# Patient Record
Sex: Female | Born: 1940 | Race: Black or African American | Hispanic: No | Marital: Married
Health system: Southern US, Community
[De-identification: ages and names within clinical notes are randomized; demographics above are authoritative.]

## PROBLEM LIST (undated history)

## (undated) DIAGNOSIS — I10 Essential (primary) hypertension: Secondary | ICD-10-CM

## (undated) DIAGNOSIS — D649 Anemia, unspecified: Secondary | ICD-10-CM

## (undated) DIAGNOSIS — R5383 Other fatigue: Secondary | ICD-10-CM

## (undated) DIAGNOSIS — E7800 Pure hypercholesterolemia, unspecified: Secondary | ICD-10-CM

## (undated) DIAGNOSIS — K219 Gastro-esophageal reflux disease without esophagitis: Secondary | ICD-10-CM

## (undated) DIAGNOSIS — I7300 Raynaud's syndrome without gangrene: Secondary | ICD-10-CM

## (undated) DIAGNOSIS — F32A Depression, unspecified: Secondary | ICD-10-CM

---

## 2004-09-07 ENCOUNTER — Ambulatory Visit: Payer: Self-pay | Admitting: Physical Medicine & Rehabilitation

## 2004-09-07 ENCOUNTER — Inpatient Hospital Stay (HOSPITAL_COMMUNITY): Admission: RE | Admit: 2004-09-07 | Discharge: 2004-09-11 | Payer: Self-pay | Admitting: Orthopedic Surgery

## 2006-01-19 IMAGING — CR DG CHEST 2V
2 series · 2 of 2 positions shown · non-contrast
Comparison: None.

CLINICAL DATA: Right knee osteoarthritis.  Preoperative respiratory exam. 

CHEST - 2 VIEW:

[view not recorded (1 of 2)]
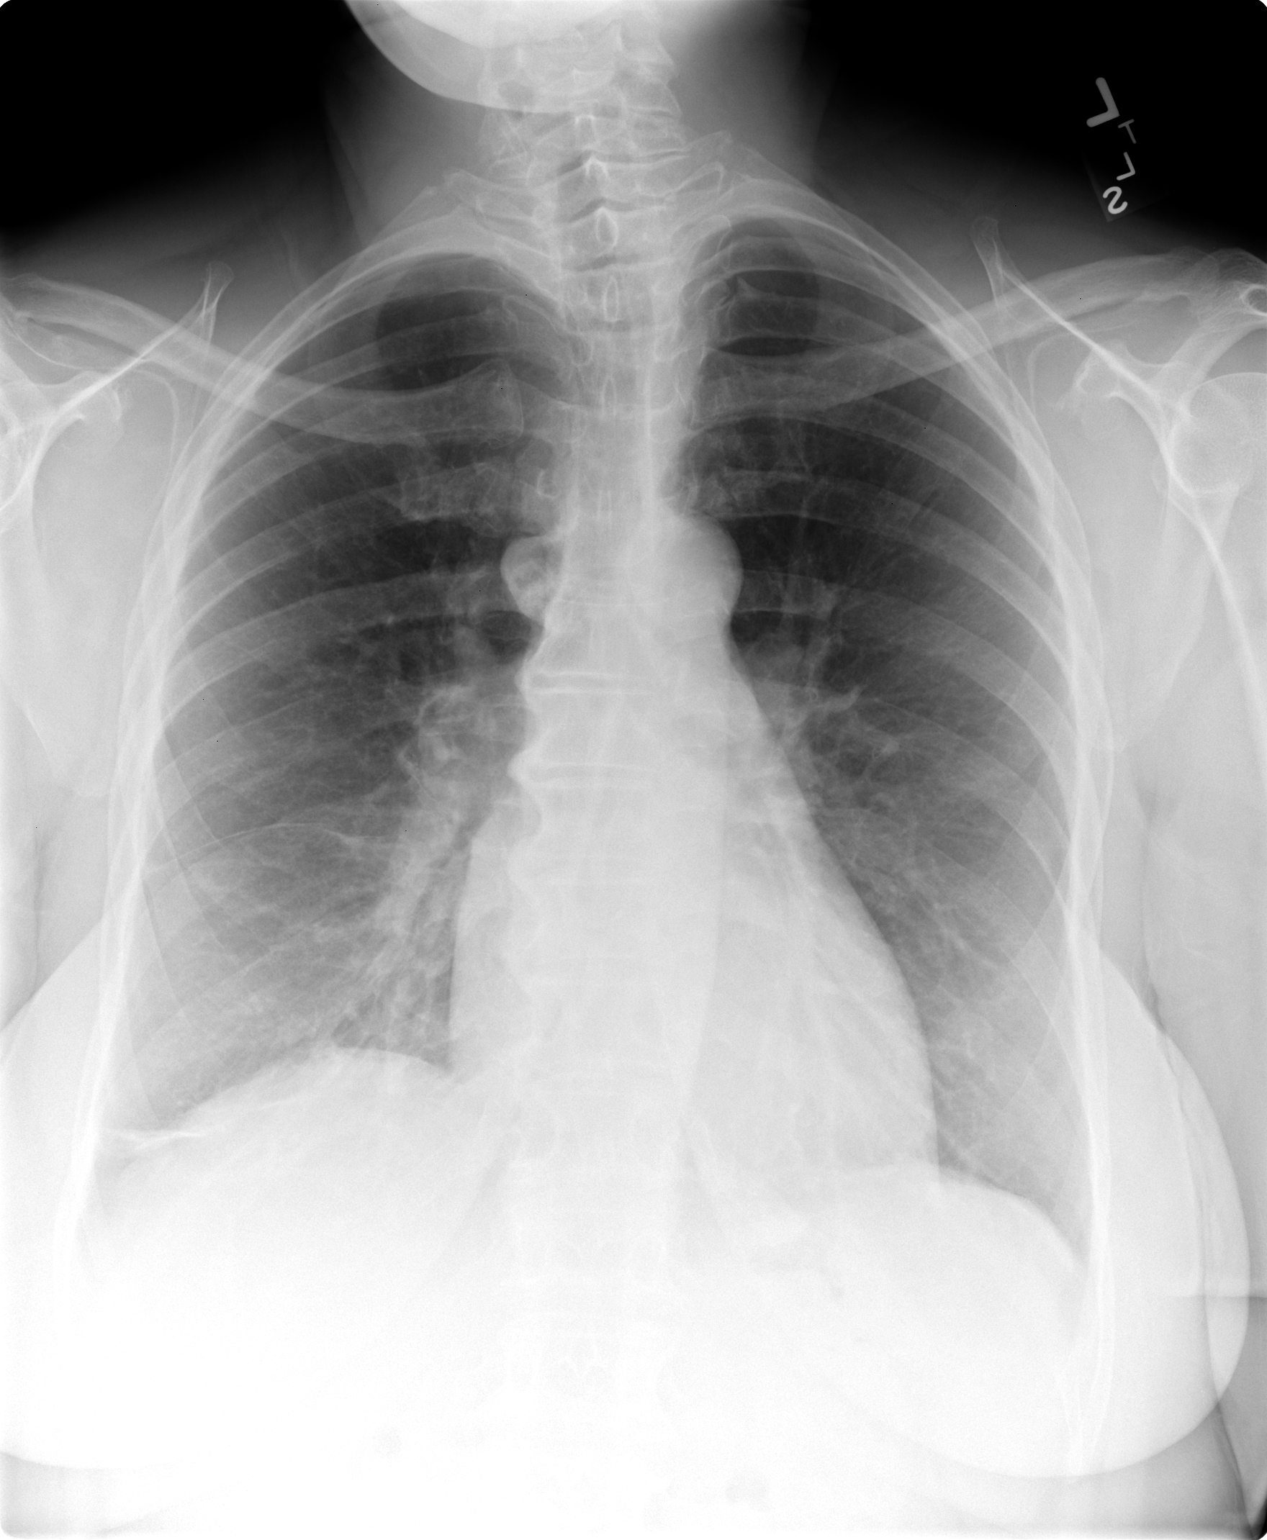

[view not recorded (2 of 2)]
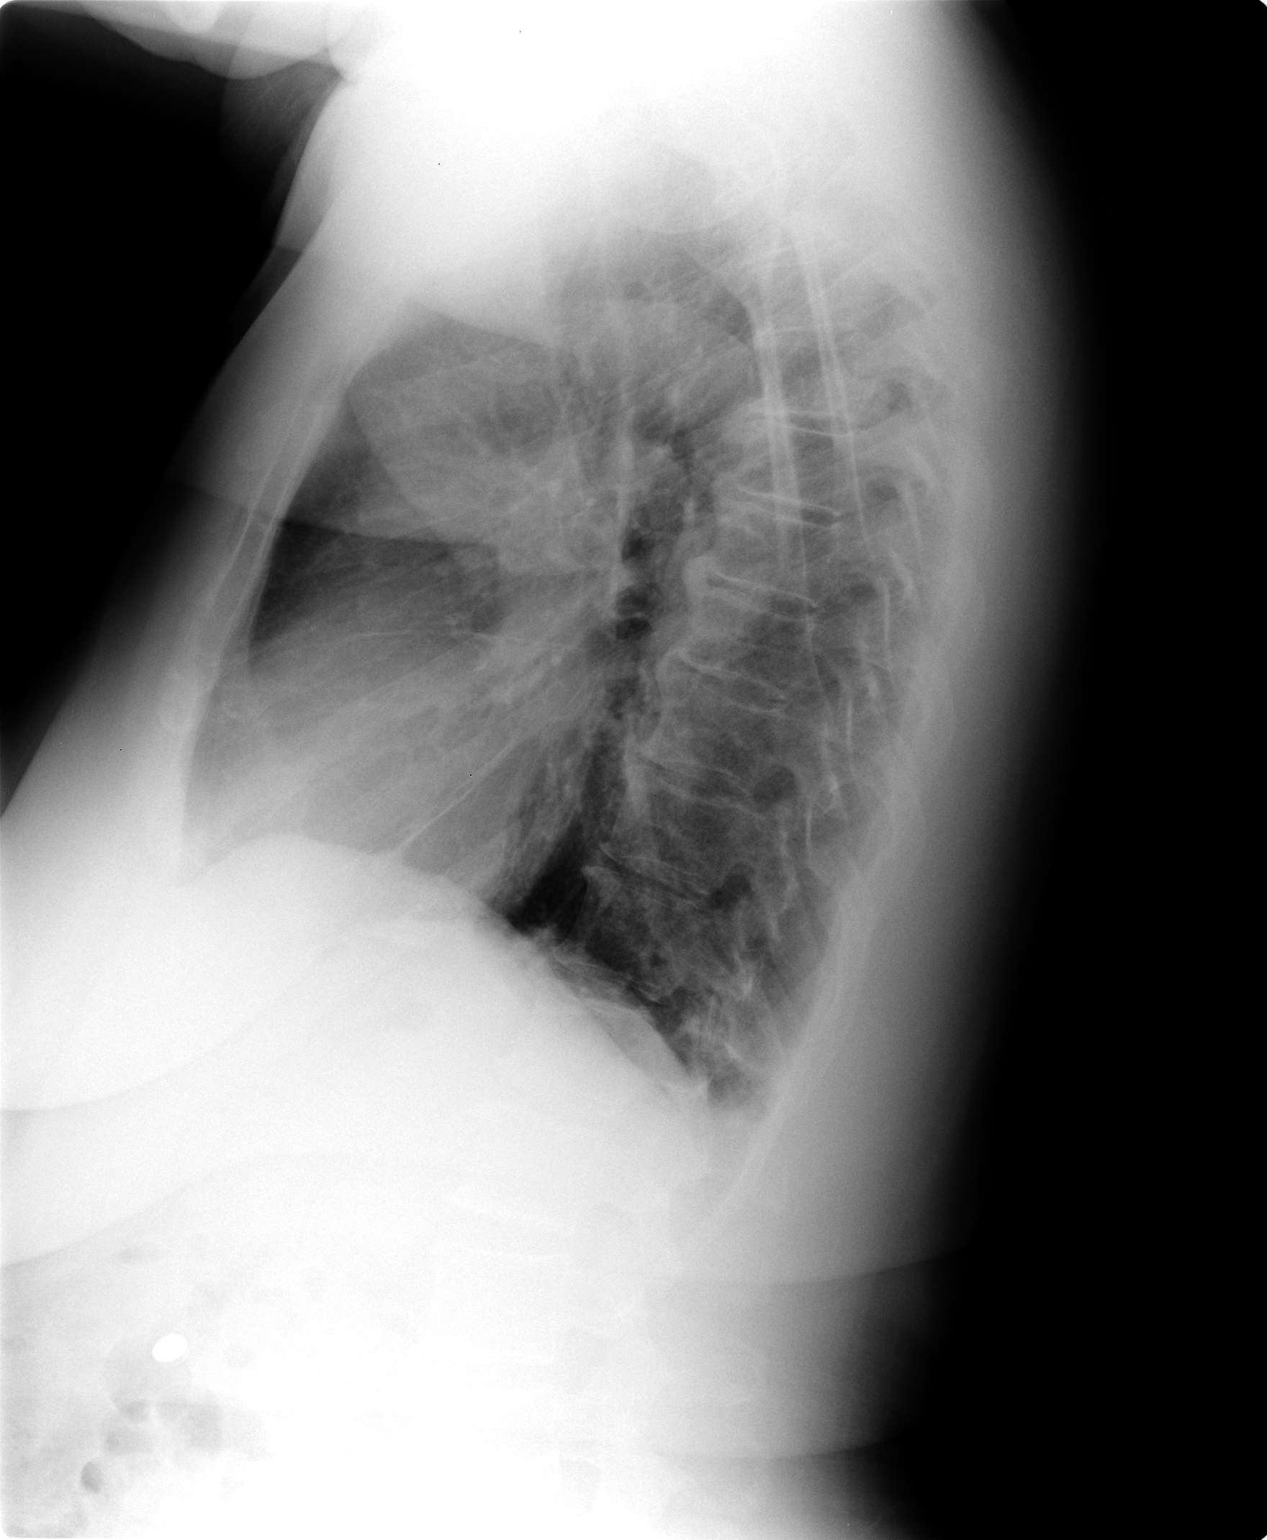

[2 of 2 positions shown; findings below may reference images not displayed]

Mild scarring is seen at the right lung base.  Lungs are otherwise clear.  Heart
size and mediastinal contours within normal limits.  Incidental note is made of
diffuse idiopathic skeletal hyperostosis.
IMPRESSION: Mild scarring at right lung base.  No active disease.

## 2006-01-26 IMAGING — CR DG CHEST 2V
2 series · 2 of 2 positions shown · non-contrast
Comparison: 09/02/04.

CLINICAL DATA: Pre-op respiratory exam.  Right knee arthritis. 
 CHEST - TWO VIEW:

[view not recorded (1 of 2)]
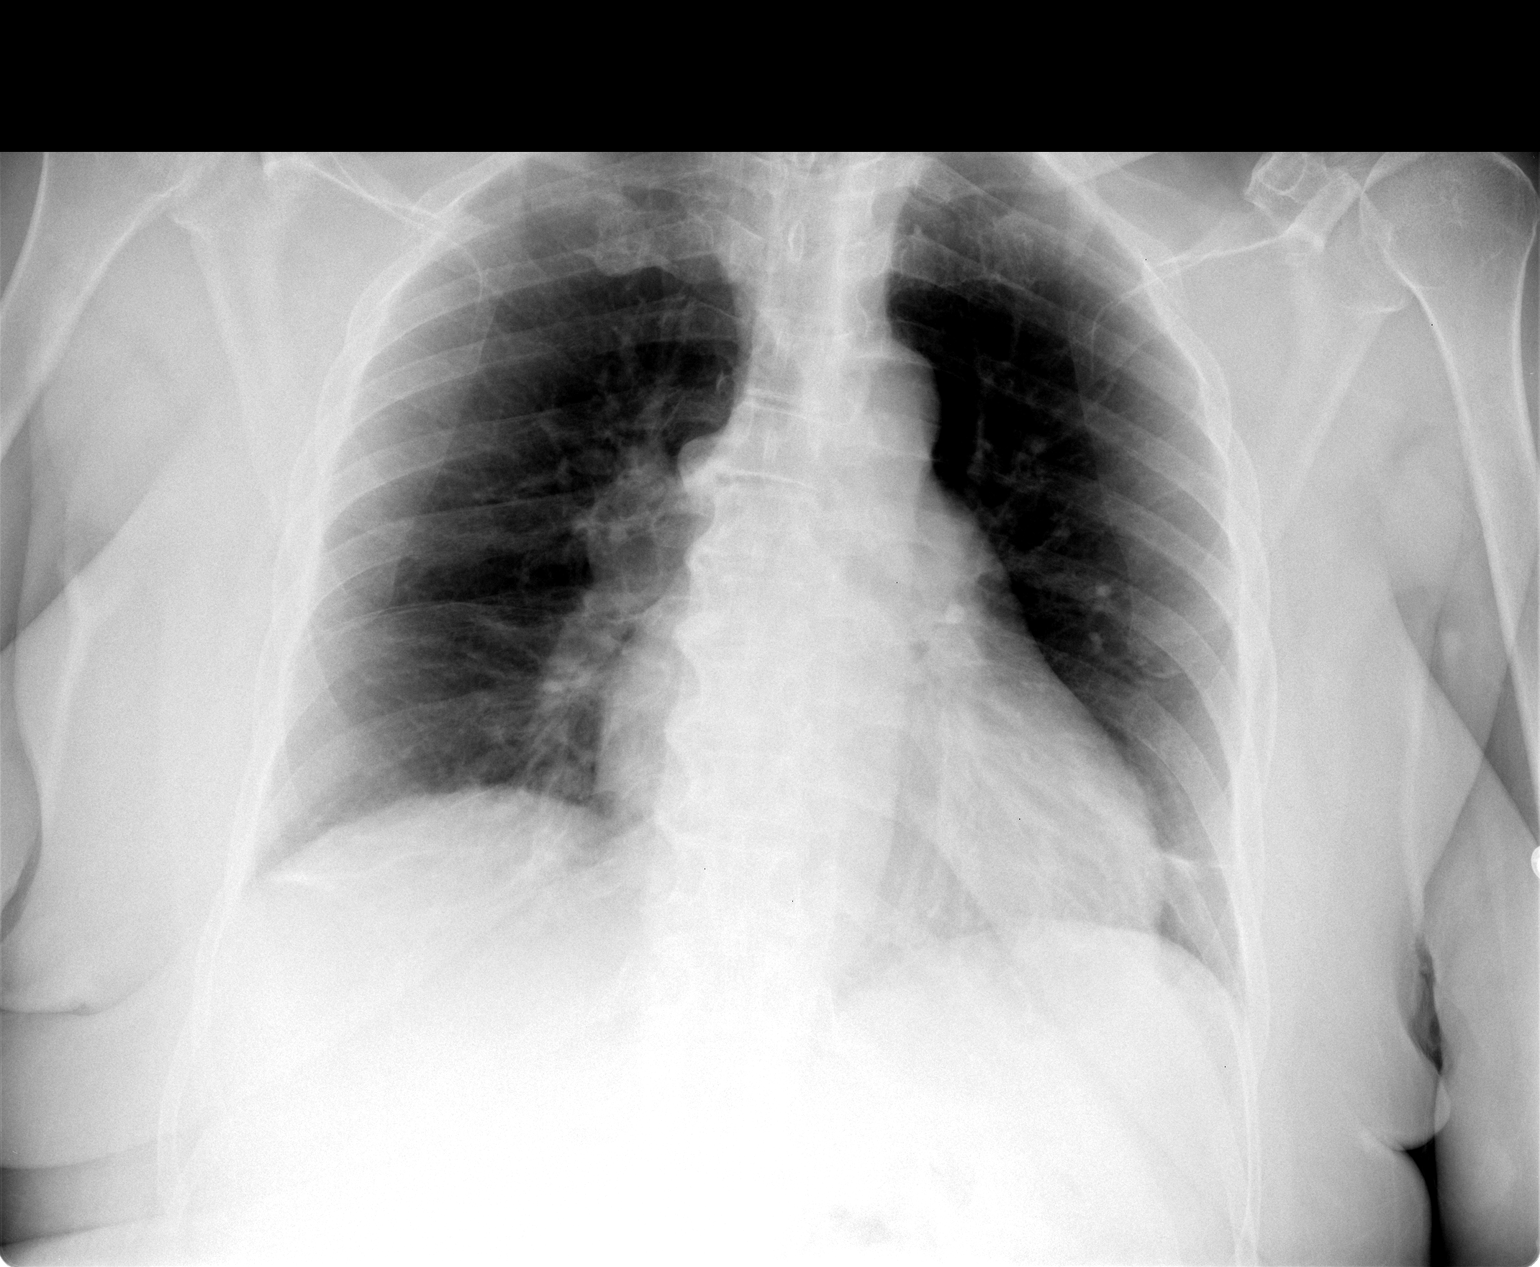

[view not recorded (2 of 2)]
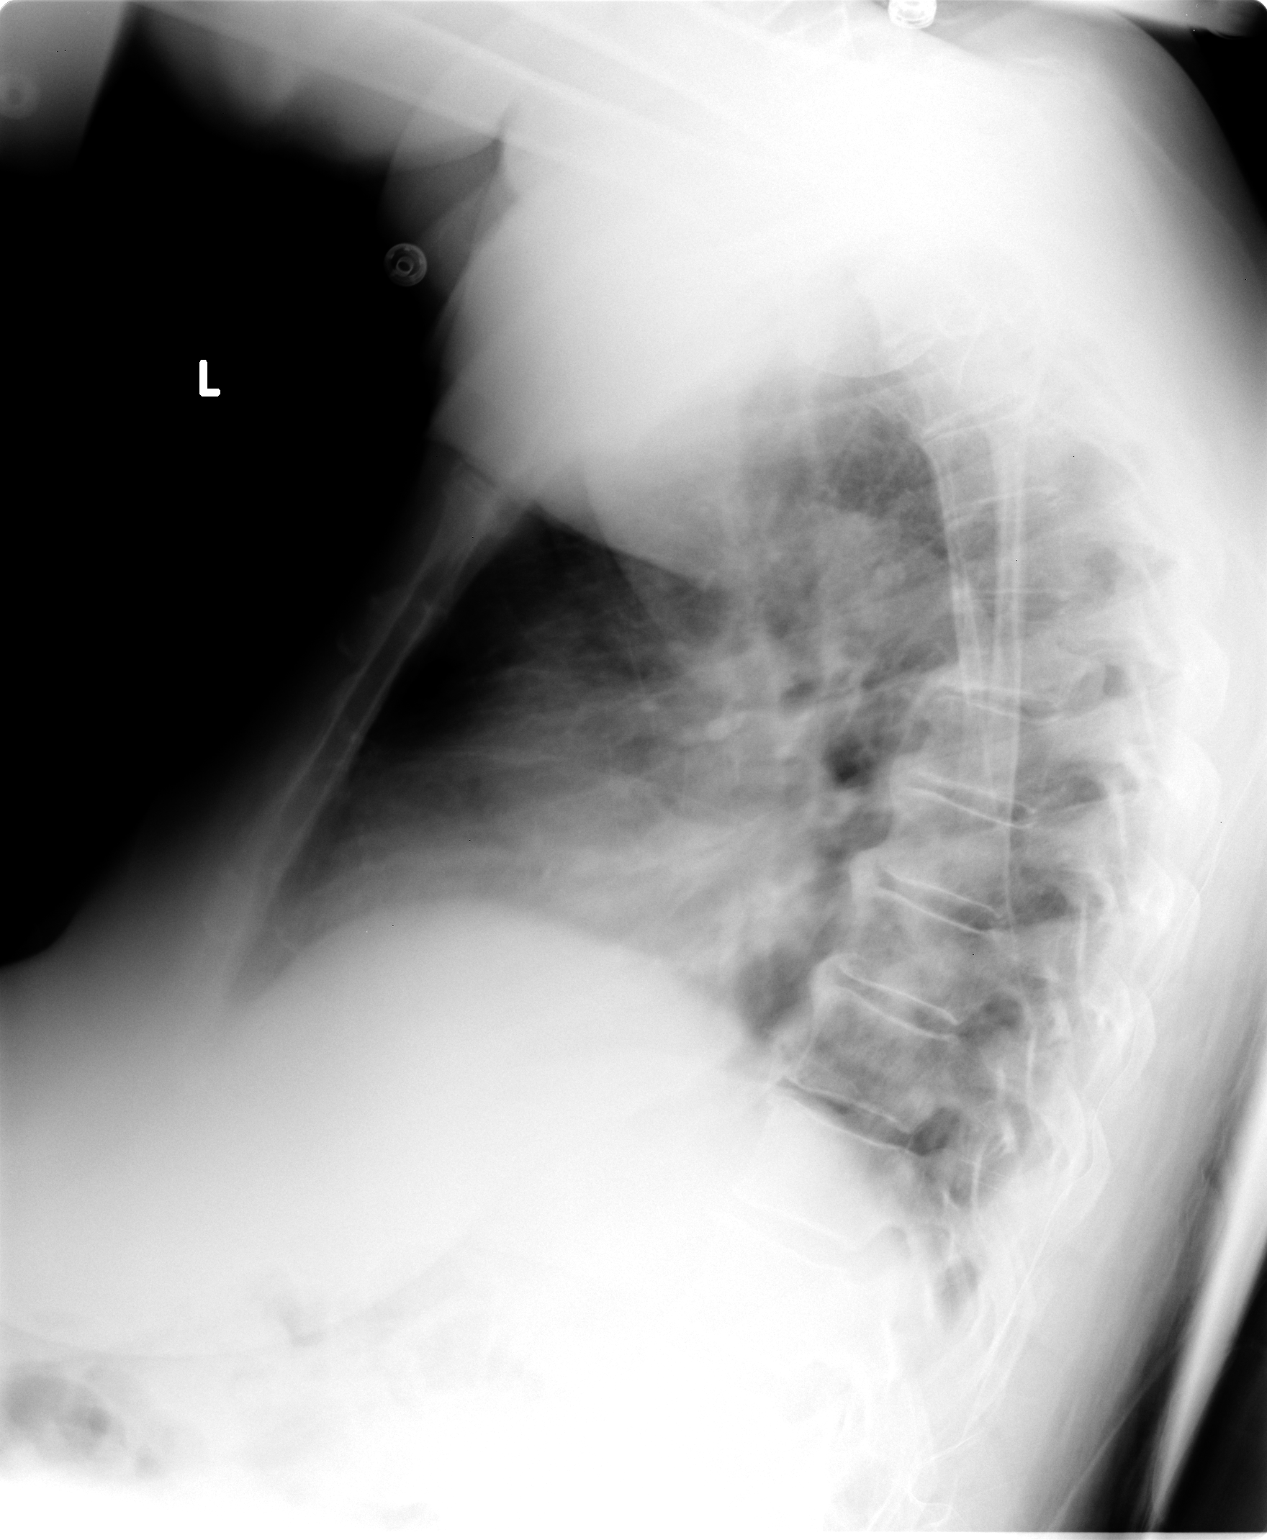

[2 of 2 positions shown; findings below may reference images not displayed]

Cardiomegaly noted.  Prominent right hilum with convex contour is noted.  Mild basilar scarring and peribronchial thickening noted.  No evidence of airspace disease.  Thoracic spondylosis noted.
IMPRESSION: 1.  Prominent right hilum.  If no remote films are available for comparison, recommend CT. 
 2.  Cardiomegaly and basilar scarring.  Mild peribronchial thickening.

## 2021-02-11 ENCOUNTER — Emergency Department (HOSPITAL_BASED_OUTPATIENT_CLINIC_OR_DEPARTMENT_OTHER)
Admission: EM | Admit: 2021-02-11 | Discharge: 2021-02-12 | Disposition: A | Discharge status: Home / Self Care | Payer: Medicare Other | Attending: Emergency Medicine | Admitting: Emergency Medicine

## 2021-02-11 ENCOUNTER — Other Ambulatory Visit: Payer: Self-pay

## 2021-02-11 ENCOUNTER — Emergency Department (HOSPITAL_BASED_OUTPATIENT_CLINIC_OR_DEPARTMENT_OTHER): Payer: Medicare Other

## 2021-02-11 ENCOUNTER — Encounter (HOSPITAL_BASED_OUTPATIENT_CLINIC_OR_DEPARTMENT_OTHER): Payer: Self-pay | Admitting: *Deleted

## 2021-02-11 DIAGNOSIS — M79602 Pain in left arm: Secondary | ICD-10-CM | POA: Diagnosis not present

## 2021-02-11 DIAGNOSIS — S79912A Unspecified injury of left hip, initial encounter: Secondary | ICD-10-CM | POA: Diagnosis present

## 2021-02-11 DIAGNOSIS — W19XXXA Unspecified fall, initial encounter: Secondary | ICD-10-CM

## 2021-02-11 DIAGNOSIS — W06XXXA Fall from bed, initial encounter: Secondary | ICD-10-CM | POA: Diagnosis not present

## 2021-02-11 DIAGNOSIS — S72112A Displaced fracture of greater trochanter of left femur, initial encounter for closed fracture: Secondary | ICD-10-CM | POA: Diagnosis not present

## 2021-02-11 DIAGNOSIS — N179 Acute kidney failure, unspecified: Secondary | ICD-10-CM | POA: Diagnosis not present

## 2021-02-11 DIAGNOSIS — I10 Essential (primary) hypertension: Secondary | ICD-10-CM | POA: Diagnosis not present

## 2021-02-11 DIAGNOSIS — R5383 Other fatigue: Secondary | ICD-10-CM | POA: Diagnosis not present

## 2021-02-11 HISTORY — DX: Gastro-esophageal reflux disease without esophagitis: K21.9

## 2021-02-11 HISTORY — DX: Depression, unspecified: F32.A

## 2021-02-11 HISTORY — DX: Raynaud's syndrome without gangrene: I73.00

## 2021-02-11 HISTORY — DX: Pure hypercholesterolemia, unspecified: E78.00

## 2021-02-11 HISTORY — DX: Essential (primary) hypertension: I10

## 2021-02-11 HISTORY — DX: Anemia, unspecified: D64.9

## 2021-02-11 HISTORY — DX: Other fatigue: R53.83

## 2021-02-11 LAB — CBC WITH DIFFERENTIAL/PLATELET
Abs Immature Granulocytes: 0.04 10*3/uL (ref 0.00–0.07)
Basophils Absolute: 0 10*3/uL (ref 0.0–0.1)
Basophils Relative: 0 %
Eosinophils Absolute: 0.1 10*3/uL (ref 0.0–0.5)
Eosinophils Relative: 1 %
HCT: 28 % — ABNORMAL LOW (ref 36.0–46.0)
Hemoglobin: 9.4 g/dL — ABNORMAL LOW (ref 12.0–15.0)
Immature Granulocytes: 1 %
Lymphocytes Relative: 13 %
Lymphs Abs: 0.8 10*3/uL (ref 0.7–4.0)
MCH: 35.1 pg — ABNORMAL HIGH (ref 26.0–34.0)
MCHC: 33.6 g/dL (ref 30.0–36.0)
MCV: 104.5 fL — ABNORMAL HIGH (ref 80.0–100.0)
Monocytes Absolute: 0.6 10*3/uL (ref 0.1–1.0)
Monocytes Relative: 9 %
Neutro Abs: 4.8 10*3/uL (ref 1.7–7.7)
Neutrophils Relative %: 76 %
Platelets: 91 10*3/uL — ABNORMAL LOW (ref 150–400)
RBC: 2.68 MIL/uL — ABNORMAL LOW (ref 3.87–5.11)
RDW: 13.8 % (ref 11.5–15.5)
WBC: 6.3 10*3/uL (ref 4.0–10.5)
nRBC: 0 % (ref 0.0–0.2)

## 2021-02-11 LAB — BASIC METABOLIC PANEL
Anion gap: 6 (ref 5–15)
BUN: 57 mg/dL — ABNORMAL HIGH (ref 8–23)
CO2: 23 mmol/L (ref 22–32)
Calcium: 8.6 mg/dL — ABNORMAL LOW (ref 8.9–10.3)
Chloride: 107 mmol/L (ref 98–111)
Creatinine, Ser: 2.1 mg/dL — ABNORMAL HIGH (ref 0.44–1.00)
GFR, Estimated: 24 mL/min — ABNORMAL LOW (ref >60)
Glucose, Bld: 294 mg/dL — ABNORMAL HIGH (ref 70–99)
Potassium: 4.8 mmol/L (ref 3.5–5.1)
Sodium: 136 mmol/L (ref 135–145)

## 2021-02-11 LAB — PROTIME-INR
INR: 1.1 (ref 0.8–1.2)
Prothrombin Time: 13.7 seconds (ref 11.4–15.2)

## 2021-02-11 MED ORDER — MORPHINE SULFATE (PF) 2 MG/ML IV SOLN
2.0000 mg | Freq: Once | INTRAVENOUS | Status: AC
  Administered 2021-02-11: 2 mg via INTRAVENOUS

## 2021-02-11 MED ORDER — MORPHINE SULFATE (PF) 4 MG/ML IV SOLN
4.0000 mg | INTRAVENOUS | Status: DC | PRN
  Filled 2021-02-11: qty 1

## 2021-02-11 NOTE — ED Triage Notes (Signed)
Son reports fall from bed x 2 days ago , ptis non verbal but yells with pain to hips, legs and shoulders x 2 days, seen at [facility] unable to do xrays and sent here

## 2021-02-11 NOTE — ED Notes (Signed)
Per daughter request, MSO4 2mg  IV given instead of MSO4 4mg .

## 2021-02-11 NOTE — ED Notes (Signed)
Patient transported to XR. 

## 2021-02-11 NOTE — ED Provider Notes (Signed)
[facility] EMERGENCY DEPARTMENT Provider Note   CSN: [id number] Arrival date & time: 02/11/21  1820     History Chief Complaint  Patient presents with   Melissa Lawson Melissa Lawson is a 80 y.o. female.  Level 5 caveat secondary to nonverbal.  Patient is brought in by family member after she rolled out of bed and fell onto her left side 2 days ago.  Since then she has been seeming to have pain when any attempt to move her left arm or left leg.  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  The history is provided by a relative.  Fall This is a new problem. The current episode started 2 days ago. The problem has not changed since onset.The symptoms are aggravated by bending and twisting. Nothing relieves the symptoms. She has tried rest for the symptoms. The treatment provided no relief.      Past Medical History:  Diagnosis Date   Anemia    Depression    Fatigue    GERD (gastroesophageal reflux disease)    Hypercholesterolemia    Hypertension    Raynaud disease     There are no problems to display for this patient.   History reviewed. No pertinent surgical history.   OB History   No obstetric history on file.     No family history on file.  Social History   Substance Use Topics   Alcohol use: Not Currently    Home Medications Prior to Admission medications   Medication Sig Start Date End Date Taking? Authorizing Provider  promethazine-codeine (PHENERGAN WITH CODEINE) 6.25-10 MG/5ML syrup Take 5 mLs by mouth every 4 (four) hours as needed (for pain; may cause constipation). 02/12/21  Yes Molpus, John, MD    Allergies    Patient has no known allergies.  Review of Systems   Review of Systems  Unable to perform ROS: Patient nonverbal   Physical Exam Updated Vital Signs BP 133/66   Pulse 68   Temp 98.2 F (36.8 C)   Resp 20   Wt 49 kg   SpO2 99%   Physical Exam Vitals and nursing note  reviewed.  Constitutional:      General: She is not in acute distress.    Appearance: She is well-developed.  HENT:     Head: Normocephalic and atraumatic.  Eyes:     Conjunctiva/sclera: Conjunctivae normal.  Cardiovascular:     Rate and Rhythm: Normal rate and regular rhythm.     Heart sounds: No murmur heard. Pulmonary:     Effort: Pulmonary effort is normal. No respiratory distress.     Breath sounds: Normal breath sounds.  Abdominal:     Palpations: Abdomen is soft.     Tenderness: There is no abdominal tenderness. There is no guarding or rebound.  Musculoskeletal:        General: Tenderness present.     Cervical back: Neck supple.     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  She yells in pain with any effort to manipulate her left arm or left leg.  No definite deformity or crepitus appreciated.  Skin:    General: Skin is warm and dry.  Neurological:     Mental Status: She is alert. Mental status is at baseline.    ED Results / Procedures / Treatments   Labs (all labs ordered are listed, but only abnormal results are displayed) Labs  Reviewed - No data to display  EKG None  Radiology DG Tibia/Fibula Left  Result Date: 02/11/2021 CLINICAL DATA:  Fall with leg pain EXAM: LEFT TIBIA AND FIBULA - 2 VIEW COMPARISON:  None. FINDINGS: Bones appear demineralized. Left knee replacement. No fracture or malalignment. Vascular calcifications. IMPRESSION: No acute osseous abnormality Electronically Signed   By: Jasmine Pang M.D.   On: 02/11/2021 21:55   CT Hip Left Wo Contrast  Result Date: 02/11/2021 CLINICAL DATA:  Fractured hip left side.  Status post fall. EXAM: CT OF THE LEFT HIP WITHOUT CONTRAST TECHNIQUE: Multidetector CT imaging of the left hip was performed according to the standard protocol. Multiplanar CT image reconstructions were also generated. COMPARISON:  X-ray left hip 02/11/2021 FINDINGS: Bones/Joint/Cartilage Minimally displaced greater  trochanteric fracture (2:88). Densely sclerotic sacral lesion likely a bone island. No other acute displaced fracture. No hip dislocation bilaterally. Ligaments Suboptimally assessed by CT. Muscles and Tendons Atrophic. Soft tissues: Diffuse subcutaneus soft tissue edema with increased edema along the left hip. No large hematoma formation. No retained radiopaque foreign body. Other: Severe atherosclerotic plaque.  Diverticulosis. IMPRESSION: Left greater trochanter minimally displaced fracture. Electronically Signed   By: Tish Frederickson M.D.   On: 02/11/2021 22:46   DG Humerus Left  Result Date: 02/11/2021 CLINICAL DATA:  Status post fall. EXAM: LEFT HUMERUS - 2+ VIEW COMPARISON:  None. FINDINGS: Suboptimal, non anatomic imaging of the left humerus obtained secondary to contractions. No underlying fracture or dislocation identified. No radio-opaque foreign body or soft tissue calcifications. IMPRESSION: No acute findings. Suboptimal exam. Electronically Signed   By: Signa Kell M.D.   On: 02/11/2021 21:56   DG Hip Unilat With Pelvis 2-3 Views Left  Result Date: 02/11/2021 CLINICAL DATA:  Fall, pain EXAM: DG HIP (WITH OR WITHOUT PELVIS) 2-3V LEFT COMPARISON:  CT 10/26/2020 FINDINGS: Limited by positioning. No femoral head dislocation. Pubic symphysis and rami appear intact. Suspect fracture at the left femoral neck. There are indeterminate lucencies at the greater trochanter. IMPRESSION: Limited by patient positioning. Suspect that there may be a left femoral neck fracture. There are indeterminate lucencies at the greater trochanter of left femur. CT recommended for further evaluation Electronically Signed   By: Jasmine Pang M.D.   On: 02/11/2021 21:54    Procedures Procedures   Medications Ordered in ED Medications  morphine 2 MG/ML injection 2 mg (2 mg Intravenous Given 02/11/21 2209)  morphine 2 MG/ML injection 2 mg (2 mg Intramuscular Given 02/12/21 0135)    ED Course  I have reviewed the  triage vital signs and the nursing notes.  Pertinent labs & imaging results that were available during my care of the patient were reviewed by me and considered in my medical decision making (see chart for details).  Clinical Course as of 02/12/21 1041  Wed Feb 11, 2021  2142 Baseline hemoglobin around 8.5.  Baseline creatinine is 1.4 - 1.6 [MB]  2155 X-rays were very technically difficult due to her contractures.  Possible left hip fracture.  Do not see anything obvious on the humerus and tib-fib.  Radiology was unable to get the forearm due to her contractures. [MB]  2343 Low platelets have been seen in the past. [MB]    Clinical Course User Index [MB] Terrilee Files, MD   MDM Rules/Calculators/A&P                         This patient complains of pain in the setting of  a recent fall; this involves an extensive number of treatment Options and is a complaint that carries with it a high risk of complications and Morbidity. The differential includes fracture, dislocation, contusion  I ordered, reviewed and interpreted labs, which included chemistries with low white count, hemoglobin stable from priors, platelets low have been seen in the past, chemistries with elevated BUN and creatinine greater than priors I ordered medication IV pain medication with improvement in her symptoms I ordered imaging studies which included left humerus left pelvis and hip, left tib-fib, CT pelvis and hip and I independently    visualized and interpreted imaging which showed greater tuberosity fracture on the left Additional history obtained from patient's daughter Previous records obtained and reviewed in epic including care everywhere  After the interventions stated above, I reevaluated the patient and found patient to be resting comfortable when not moving.  Imaging shows greater tuberosity fracture.  Have placed consult into orthopedics Dr. Ave Filter.  Care signed out to oncoming provider Dr. Read Drivers to  follow-up on orthopedic recommendations.  If there is no operative intervention daughter is comfortable with patient returning home.   Final Clinical Impression(s) / ED Diagnoses Final diagnoses:  Fall, initial encounter  Closed displaced fracture of greater trochanter of left femur, initial encounter (HCC)  AKI (acute kidney injury) (HCC)    Rx / DC Orders ED Discharge Orders     None        Terrilee Files, MD 02/12/21 1044

## 2021-02-12 ENCOUNTER — Encounter (HOSPITAL_BASED_OUTPATIENT_CLINIC_OR_DEPARTMENT_OTHER): Payer: Self-pay | Admitting: Emergency Medicine

## 2021-02-12 ENCOUNTER — Emergency Department (HOSPITAL_BASED_OUTPATIENT_CLINIC_OR_DEPARTMENT_OTHER): Payer: Medicare Other

## 2021-02-12 DIAGNOSIS — S72112A Displaced fracture of greater trochanter of left femur, initial encounter for closed fracture: Secondary | ICD-10-CM | POA: Diagnosis not present

## 2021-02-12 LAB — CBG MONITORING, ED: Glucose-Capillary: 109 mg/dL — ABNORMAL HIGH (ref 70–99)

## 2021-02-12 MED ORDER — MORPHINE SULFATE (PF) 2 MG/ML IV SOLN
2.0000 mg | Freq: Once | INTRAVENOUS | Status: AC
  Administered 2021-02-12: 2 mg via INTRAMUSCULAR

## 2021-02-12 MED ORDER — PROMETHAZINE-CODEINE 6.25-10 MG/5ML PO SYRP: 5.0000 mL | ORAL_SOLUTION | ORAL | 0 refills | Status: AC | PRN

## 2021-02-12 MED ORDER — MORPHINE SULFATE (PF) 2 MG/ML IV SOLN
2.0000 mg | Freq: Once | INTRAVENOUS | Status: DC
  Filled 2021-02-12: qty 1

## 2021-02-12 NOTE — ED Notes (Signed)
Spoke to daughter and let her know that [facility] was here to transport patient home.

## 2021-02-12 NOTE — Patient Care Conference (Signed)
Received call from Dr Read Drivers at [facility] regarding patient in ED who fell from bed 2 days ago. Patient is apparently nonverbal, bed to chair transfers only, well-cared for by family. Patient complaining of left hip pain. Xrays of the left hip were inconclusive given patient positioning and according to Dr Read Drivers, "underlying contractures".   Review of xray of the left hip does initially look concerning for femoral neck fracture but confirmatory CT shows minimally displaced greater trochanteric fracture but no femoral neck fracture. Densely sclerotic sacral lesion likely a bone island. No other acute displaced fracture. No hip dislocation bilaterally.  Given the patient's underlying status, I feel that it is appropriate for her to continue with bed to chair transfers, as she is not ambulatory anyway. No surgical intervention is necessary. She can follow up with [place] in the office in 10-14 days for recheck. Will review films with Dr Ave Filter in the morning.   Fredia Sorrow, [facility] 02/12/2021 12:50am

## 2021-02-12 NOTE — ED Notes (Signed)
Pt resting comfortably with eyes closed. Even, unlabored respirations noted. Pt awaiting transport arrival.

## 2021-02-12 NOTE — ED Notes (Signed)
Resting quielty with eyes closed. No signs of distress. Spoke with family and informed them we still have no ETA on transport to get her home.

## 2021-02-12 NOTE — ED Notes (Signed)
Attempted to transfer pt to wheelchair, but pt grimaced and yelled out. Pt not tolerating a sitting position. Pt placed back in bed. Pt to be transferred home via ambulance.

## 2021-02-12 NOTE — ED Notes (Signed)
Blood Sugar 109. Fed patient a bowl of oatmeal. Tolerated well.

## 2021-02-12 NOTE — ED Notes (Signed)
Patient cleaned of urine and stool. Bed linen change. Repositioned with pillows to prevent skin breakdown. Dry pull up in place.

## 2021-02-12 NOTE — ED Provider Notes (Signed)
Discussed with Dr. Veda Canning PA.  She agrees the patient can be treated conservatively with nonweightbearing (patient is only bedbound and wheelchair-bound anyway, never weightbearing) and analgesia.  They can see her in the office at the family's convenience.     Deanglo Hissong, Jonny Ruiz, MD 02/12/21 [phone]

## 2021-06-09 DEATH — deceased

## 2022-06-30 IMAGING — DX DG HIP (WITH OR WITHOUT PELVIS) 2-3V*L*
2 series · 2 of 2 positions shown · non-contrast
Comparison: CT 10/26/2020

CLINICAL DATA: Fall, pain

EXAM:
DG HIP (WITH OR WITHOUT PELVIS) 2-3V LEFT

[hip lat]
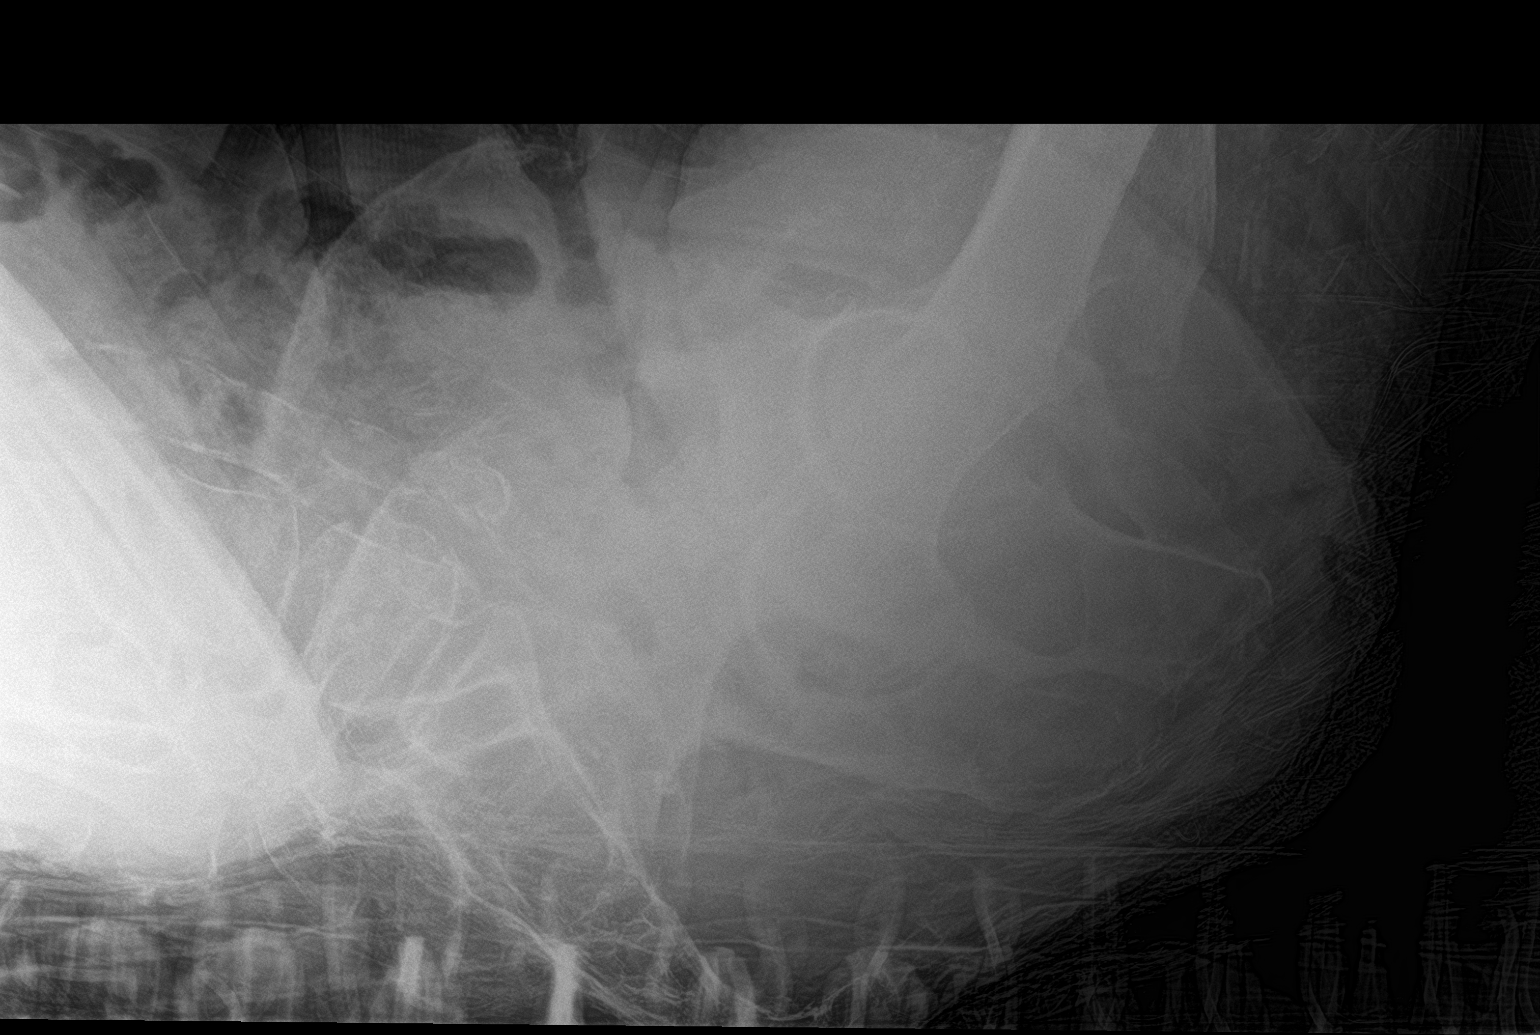

[pelvis ap]
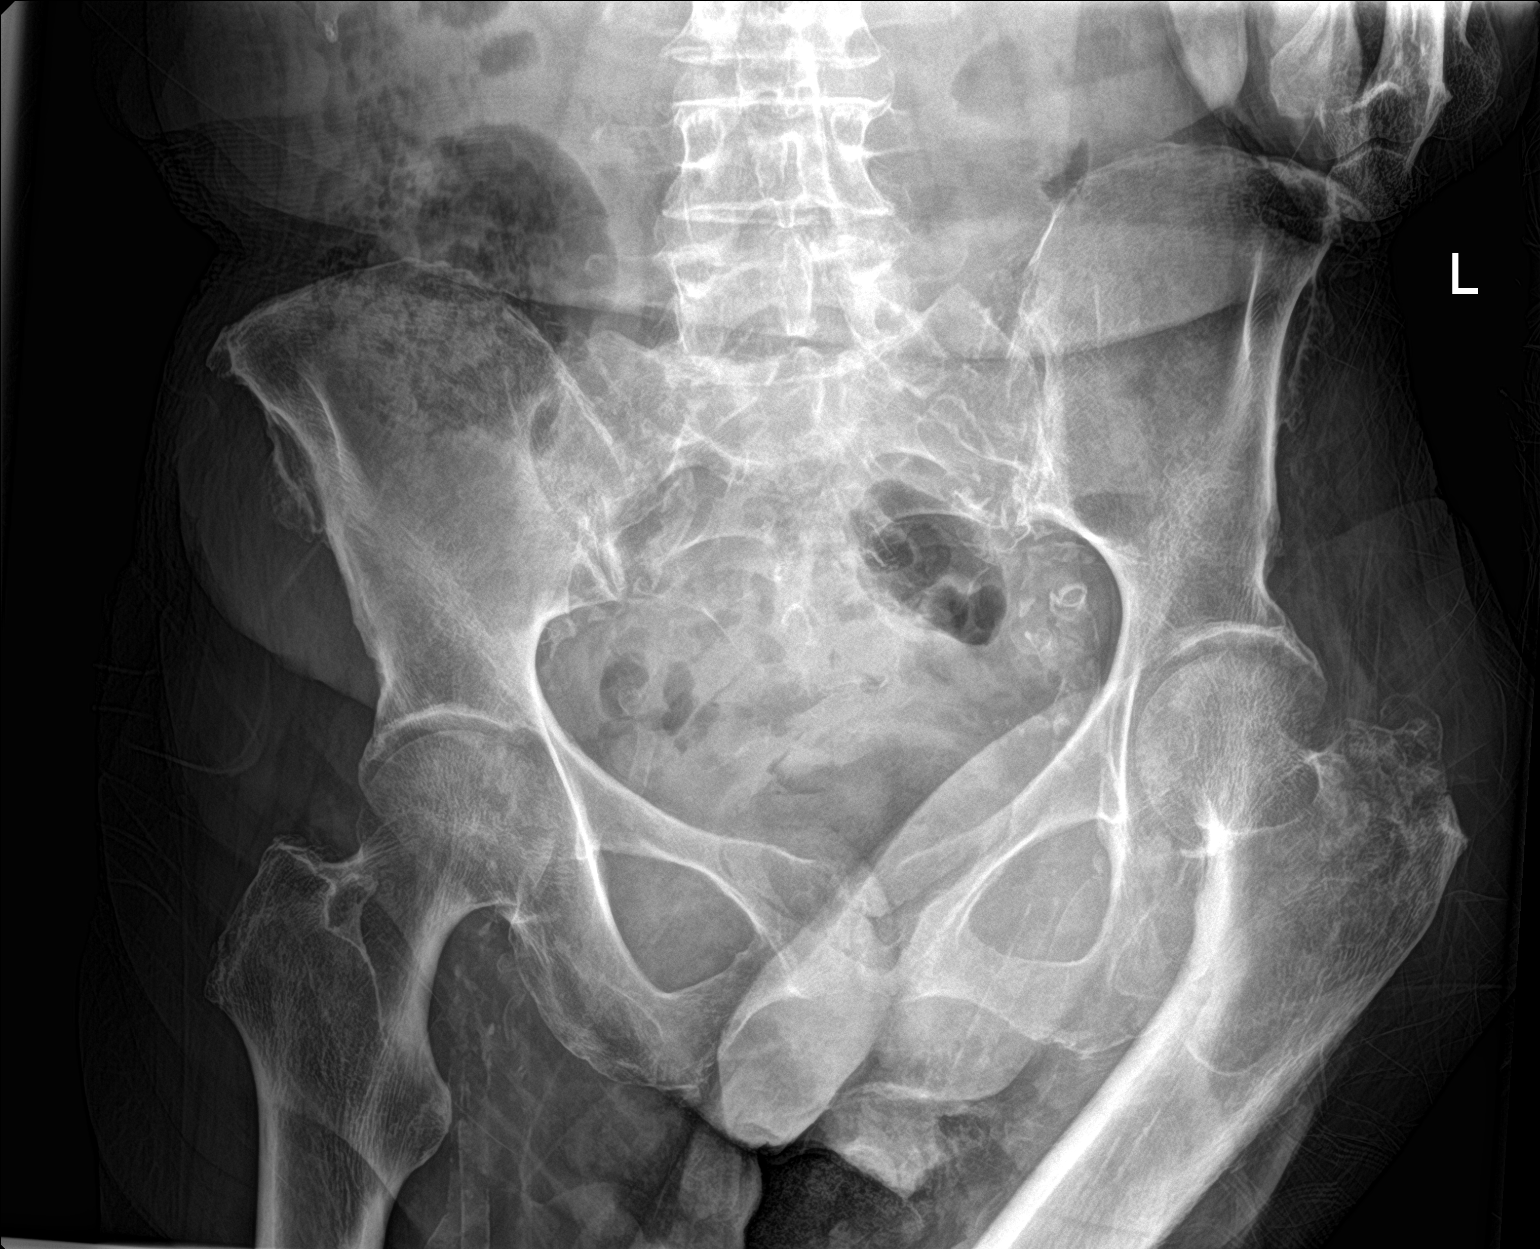

[2 of 2 positions shown; findings below may reference images not displayed]

FINDINGS: Limited by positioning. No femoral head dislocation. Pubic symphysis
and rami appear intact. Suspect fracture at the left femoral neck.
There are indeterminate lucencies at the greater trochanter.
IMPRESSION: Limited by patient positioning. Suspect that there may be a left
femoral neck fracture. There are indeterminate lucencies at the
greater trochanter of left femur. CT recommended for further
evaluation
